# Patient Record
Sex: Male | Born: 1995 | Race: White | Hispanic: Yes | Marital: Single | Smoking: Never smoker
Health system: Southern US, Community
[De-identification: ages and names within clinical notes are randomized; demographics above are authoritative.]

---
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
## 2014-10-13 DIAGNOSIS — L84 Corns and callosities: Secondary | ICD-10-CM | POA: Insufficient documentation

## 2014-10-13 DIAGNOSIS — M79671 Pain in right foot: Secondary | ICD-10-CM | POA: Diagnosis present

## 2014-10-14 ENCOUNTER — Emergency Department (HOSPITAL_COMMUNITY)
Admission: EM | Admit: 2014-10-14 | Discharge: 2014-10-14 | Disposition: A | Discharge status: Home / Self Care | Payer: Medicaid Other | Attending: Emergency Medicine | Admitting: Emergency Medicine

## 2014-10-14 ENCOUNTER — Encounter (HOSPITAL_COMMUNITY): Payer: Self-pay | Admitting: Emergency Medicine

## 2014-10-14 DIAGNOSIS — L84 Corns and callosities: Secondary | ICD-10-CM

## 2014-10-14 NOTE — ED Provider Notes (Signed)
CSN: [id number]     Arrival date & time 10/13/14  2352 History   First MD Initiated Contact with Patient 10/14/14 0029     Chief Complaint  Patient presents with  . Foot Pain     (Consider location/radiation/quality/duration/timing/severity/associated sxs/prior Treatment) Patient is a 19 y.o. male presenting with lower extremity pain. The history is provided by the patient. No language interpreter was used.  Foot Pain This is a chronic problem. The current episode started more than 1 month ago. The problem occurs constantly. The problem has been waxing and waning. The symptoms are aggravated by walking.    History reviewed. No pertinent past medical history. History reviewed. No pertinent past surgical history. No family history on file. History  Substance Use Topics  . Smoking status: Never Smoker   . Smokeless tobacco: Not on file  . Alcohol Use: Yes    Review of Systems  All other systems reviewed and are negative.     Allergies  Review of patient's allergies indicates no known allergies.  Home Medications   Prior to Admission medications   Not on File   BP 116/67 mmHg  Pulse 64  Temp(Src) 98.2 F (36.8 C) (Oral)  Resp 18  Ht 5\' 11"  (1.803 m)  Wt 252 lb 14.4 oz (114.715 kg)  BMI 35.29 kg/m2  SpO2 96% Physical Exam  Constitutional: He is oriented to person, place, and time. He appears well-developed and well-nourished.  HENT:  Head: Normocephalic.  Eyes: Pupils are equal, round, and reactive to light.  Neck: Neck supple.  Cardiovascular: Normal rate and regular rhythm.   Pulmonary/Chest: Effort normal and breath sounds normal.  Abdominal: Soft. Bowel sounds are normal.  Musculoskeletal: He exhibits tenderness. He exhibits no edema.  Corn in arch of right foot  Lymphadenopathy:    He has no cervical adenopathy.  Neurological: He is alert and oriented to person, place, and time.  Skin: Skin is warm and dry.  Psychiatric: He has a normal mood and affect.   Nursing note and vitals reviewed.   ED Course  Procedures (including critical care time) Labs Review Labs Reviewed - No data to display  Imaging Review No results found.   EKG Interpretation None      MDM   Final diagnoses:  None    Foot corn.    Felicie Mornavid Lindy Garczynski, NP 10/14/14 [phone]  Geoffery Lyonsouglas Delo, MD 10/14/14 [phone]

## 2014-10-14 NOTE — ED Notes (Signed)
Pt presents with blister to bottom of right foot for the past 2 months- denies drainage from site.  Denies injury.

## 2014-10-14 NOTE — Discharge Instructions (Signed)
Corns and Calluses Corns are small areas of thickened skin that usually occur on the top, sides, or tip of a toe. They contain a cone-shaped core with a point that can press on a nerve below. This causes pain. Calluses are areas of thickened skin that usually develop on hands, fingers, palms, soles of the feet, and heels. These are areas that experience frequent friction or pressure. CAUSES  Corns are usually the result of rubbing (friction) or pressure from shoes that are too tight or do not fit properly. Calluses are caused by repeated friction and pressure on the affected areas. SYMPTOMS  A hard growth on the skin.  Pain or tenderness under the skin.  Sometimes, redness and swelling.  Increased discomfort while wearing tight-fitting shoes. DIAGNOSIS  Your caregiver can usually tell what the problem is by doing a physical exam. TREATMENT  Removing the cause of the friction or pressure is usually the only treatment needed. However, sometimes medicines can be used to help soften the hardened, thickened areas. These medicines include salicylic acid plasters and 12% ammonium lactate lotion. These medicines should only be used under the direction of your caregiver. HOME CARE INSTRUCTIONS   Try to remove pressure from the affected area.  You may wear donut-shaped corn pads to protect your skin.  You may use a pumice stone or nonmetallic nail file to gently reduce the thickness of a corn.  Wear properly fitted footwear.  If you have calluses on the hands, wear gloves during activities that cause friction.  If you have diabetes, you should regularly examine your feet. Tell your caregiver if you notice any problems with your feet. SEEK IMMEDIATE MEDICAL CARE IF:   You have increased pain, swelling, redness, or warmth in the affected area.  Your corn or callus starts to drain fluid or bleeds.  You are not getting better, even with treatment. Document Released: 04/09/2004 Document  Revised: 09/26/2011 Document Reviewed: 03/01/2011 [facility] Patient Information 2015 [employer] [place]. This information is not intended to replace advice given to you by your health care provider. Make sure you discuss any questions you have with your health care provider.  Stop by the pharmacy and pick up either curad medi-plast or Dr. Margart SicklesScholl's callous remover, and use as directed.

## 2019-05-31 DIAGNOSIS — Q631 Lobulated, fused and horseshoe kidney: Secondary | ICD-10-CM | POA: Insufficient documentation

## 2019-06-03 DIAGNOSIS — E7800 Pure hypercholesterolemia, unspecified: Secondary | ICD-10-CM | POA: Insufficient documentation

## 2023-02-19 ENCOUNTER — Ambulatory Visit
Admission: EM | Admit: 2023-02-19 | Discharge: 2023-02-19 | Disposition: A | Discharge status: Home / Self Care | Payer: 59 | Attending: Physician Assistant | Admitting: Physician Assistant

## 2023-02-19 ENCOUNTER — Other Ambulatory Visit: Payer: Self-pay

## 2023-02-19 DIAGNOSIS — Z113 Encounter for screening for infections with a predominantly sexual mode of transmission: Secondary | ICD-10-CM | POA: Insufficient documentation

## 2023-02-19 DIAGNOSIS — H6991 Unspecified Eustachian tube disorder, right ear: Secondary | ICD-10-CM | POA: Diagnosis not present

## 2023-02-19 MED ORDER — FLUTICASONE PROPIONATE 50 MCG/ACT NA SUSP: 1.0000 | Freq: Every day | NASAL | 0 refills | Status: DC

## 2023-02-19 NOTE — ED Triage Notes (Signed)
"  I have been having Right Ear Pain/fullness". First noticed "a week or two ago". No injury. No fever.

## 2023-02-19 NOTE — ED Provider Notes (Signed)
[facility]    CSN: [id number] Arrival date & time: 02/19/23  1409      History   Chief Complaint Chief Complaint  Patient presents with   Otalgia   Penis Problem    HPI Dakota Blackburn is a 27 y.o. male.   Patient here today for evaluation of right ear pain and fullness that started about a week or 2 ago.  He denies any fever.  He has not had any congestion, cough or sore throat.  He denies any known injury.  He does not report treatment for symptoms.  He also reports a penile lesion.  He states that he has a bump that was one of many, however others have resolved.  He denies any associated pain or itching.  He did not use any treatment for symptoms.  He is not concerned for STD but would like screening.  The history is provided by the patient.    History reviewed. No pertinent past medical history.  There are no problems to display for this patient.   History reviewed. No pertinent surgical history.     Home Medications    Prior to Admission medications   Medication Sig Start Date End Date Taking? Authorizing Provider  fluticasone (FLONASE) 50 MCG/ACT nasal spray Place 1 spray into both nostrils daily. 02/19/23  Yes Tomi Bamberger, PA-C    Family History History reviewed. No pertinent family history.  Social History Social History   Tobacco Use   Smoking status: Never   Smokeless tobacco: Never  Vaping Use   Vaping status: Never Used  Substance Use Topics   Alcohol use: Yes   Drug use: No     Allergies   Patient has no known allergies.   Review of Systems Review of Systems  Constitutional:  Negative for chills and fever.  HENT:  Positive for ear pain. Negative for congestion and sore throat.   Eyes:  Negative for discharge and redness.  Respiratory:  Negative for cough and shortness of breath.   Skin:  Negative for color change.  Neurological:  Negative for numbness.     Physical Exam Triage Vital Signs ED Triage Vitals   Encounter Vitals Group     BP      Systolic BP Percentile      Diastolic BP Percentile      Pulse      Resp      Temp      Temp src      SpO2      Weight      Height      Head Circumference      Peak Flow      Pain Score      Pain Loc      Pain Education      Exclude from Growth Chart    No data found.  Updated Vital Signs BP 129/88 (BP Location: Left Arm)   Pulse 78   Temp 98.6 F (37 C) (Oral)   Resp 20   Ht 5\' 11"  (1.803 m)   Wt (!) 320 lb (145.2 kg)   SpO2 97%   BMI 44.63 kg/m      Physical Exam Vitals and nursing note reviewed.  Constitutional:      General: He is not in acute distress.    Appearance: Normal appearance. He is not ill-appearing.  HENT:     Head: Normocephalic and atraumatic.     Left Ear: Tympanic membrane and ear canal normal.  Ears:     Comments: Right TM dull, nonerythematous    Nose: Nose normal. No congestion or rhinorrhea.     Mouth/Throat:     Mouth: Mucous membranes are moist.     Pharynx: Oropharynx is clear.  Eyes:     Conjunctiva/sclera: Conjunctivae normal.  Cardiovascular:     Rate and Rhythm: Normal rate.  Pulmonary:     Effort: Pulmonary effort is normal.  Genitourinary:    Comments: [name], RN chaperone- single 1 mm mildly erythematous papule noted to penis, no bleeding or drainage Neurological:     Mental Status: He is alert.  Psychiatric:        Mood and Affect: Mood normal.        Behavior: Behavior normal.        Thought Content: Thought content normal.      UC Treatments / Results  Labs (all labs ordered are listed, but only abnormal results are displayed) Labs Reviewed  RPR  HIV ANTIBODY (ROUTINE TESTING W REFLEX)  HEPATITIS PANEL, ACUTE  CYTOLOGY, (ORAL, ANAL, URETHRAL) ANCILLARY ONLY    EKG   Radiology No results found.  Procedures Procedures (including critical care time)  Medications Ordered in UC Medications - No data to display  Initial Impression / Assessment and Plan  / UC Course  I have reviewed the triage vital signs and the nursing notes.  Pertinent labs & imaging results that were available during my care of the patient were reviewed by me and considered in my medical decision making (see chart for details).    Suspect that your discomfort is due to eustachian tube dysfunction and recommended Flonase.  Advised follow-up if no gradual improvement or with any further concerns.  Discussed that penile lesion does not appear consistent with herpes or syphilis.  STD screening ordered.  Will await results for further recommendation but encouraged follow-up should rash return or lesion not resolved.  Final Clinical Impressions(s) / UC Diagnoses   Final diagnoses:  Screening for STD (sexually transmitted disease)  Dysfunction of right eustachian tube   Discharge Instructions   None    ED Prescriptions     Medication Sig Dispense Auth. Provider   fluticasone (FLONASE) 50 MCG/ACT nasal spray Place 1 spray into both nostrils daily. 48 g Tomi Bamberger, PA-C      PDMP not reviewed this encounter.   Tomi Bamberger, PA-C 02/19/23 [phone]

## 2023-02-19 NOTE — ED Triage Notes (Signed)
"  I also notice a bump on penis, on shaft, would like that looked at". No discharge. No dysuria. No concern for STI.

## 2023-02-24 ENCOUNTER — Ambulatory Visit
Admission: EM | Admit: 2023-02-24 | Discharge: 2023-02-24 | Disposition: A | Discharge status: Home / Self Care | Payer: 59 | Attending: Physician Assistant | Admitting: Physician Assistant

## 2023-02-24 DIAGNOSIS — H65191 Other acute nonsuppurative otitis media, right ear: Secondary | ICD-10-CM | POA: Diagnosis not present

## 2023-02-24 DIAGNOSIS — R519 Headache, unspecified: Secondary | ICD-10-CM | POA: Diagnosis not present

## 2023-02-24 MED ORDER — KETOROLAC TROMETHAMINE 30 MG/ML IJ SOLN
30.0000 mg | Freq: Once | INTRAMUSCULAR | Status: AC
  Administered 2023-02-24: 30 mg via INTRAMUSCULAR

## 2023-02-24 MED ORDER — KETOROLAC TROMETHAMINE 30 MG/ML IJ SOLN: 30.0000 mg | Freq: Once | INTRAMUSCULAR | Status: DC

## 2023-02-24 MED ORDER — AMOXICILLIN 500 MG PO CAPS: 500.0000 mg | ORAL_CAPSULE | Freq: Three times a day (TID) | ORAL | 0 refills | Status: DC

## 2023-02-24 NOTE — ED Provider Notes (Signed)
[facility]    CSN: [id number] Arrival date & time: 02/24/23  1608      History   Chief Complaint Chief Complaint  Patient presents with   Headache    HPI Dakota Blackburn is a 27 y.o. male.   Patient here today for continued headache and ear pressure. He reports he has been using flonase as prescribed. He notes headache is around the back of his head. He has not had any nausea, vomiting or diarrhea. He denies fever, sore throat or cough. He has not taken any medication for headache.  The history is provided by the patient.    History reviewed. No pertinent past medical history.  There are no problems to display for this patient.   History reviewed. No pertinent surgical history.     Home Medications    Prior to Admission medications   Medication Sig Start Date End Date Taking? Authorizing Provider  amoxicillin (AMOXIL) 500 MG capsule Take 1 capsule (500 mg total) by mouth 3 (three) times daily. 02/24/23  Yes Tomi Bamberger, PA-C  fluticasone (FLONASE) 50 MCG/ACT nasal spray Place 1 spray into both nostrils daily. 02/19/23   Tomi Bamberger, PA-C    Family History History reviewed. No pertinent family history.  Social History Social History   Tobacco Use   Smoking status: Never   Smokeless tobacco: Never  Vaping Use   Vaping status: Never Used  Substance Use Topics   Alcohol use: Yes   Drug use: No     Allergies   Patient has no known allergies.   Review of Systems Review of Systems  Constitutional:  Negative for chills and fever.  HENT:  Positive for ear pain. Negative for congestion and sore throat.   Eyes:  Negative for discharge and redness.  Respiratory:  Negative for cough and shortness of breath.   Gastrointestinal:  Negative for nausea and vomiting.  Neurological:  Positive for headaches.     Physical Exam Triage Vital Signs ED Triage Vitals  Encounter Vitals Group     BP      Systolic BP Percentile      Diastolic BP  Percentile      Pulse      Resp      Temp      Temp src      SpO2      Weight      Height      Head Circumference      Peak Flow      Pain Score      Pain Loc      Pain Education      Exclude from Growth Chart    No data found.  Updated Vital Signs BP (!) 143/82 (BP Location: Left Arm)   Pulse 72   Temp 97.8 F (36.6 C) (Oral)   Resp 16   SpO2 94%      Physical Exam Vitals and nursing note reviewed.  Constitutional:      General: He is not in acute distress.    Appearance: Normal appearance. He is not ill-appearing.     Comments: Smiling on exam  HENT:     Head: Normocephalic and atraumatic.     Left Ear: Tympanic membrane normal.     Ears:     Comments: Right TM erythematous    Nose: Nose normal. No congestion.  Eyes:     Conjunctiva/sclera: Conjunctivae normal.  Cardiovascular:     Rate and Rhythm: Normal rate.  Pulmonary:     Effort: Pulmonary effort is normal. No respiratory distress.  Skin:    General: Skin is warm and dry.  Neurological:     Mental Status: He is alert.  Psychiatric:        Mood and Affect: Mood normal.        Thought Content: Thought content normal.      UC Treatments / Results  Labs (all labs ordered are listed, but only abnormal results are displayed) Labs Reviewed - No data to display  EKG   Radiology No results found.  Procedures Procedures (including critical care time)  Medications Ordered in UC Medications  ketorolac (TORADOL) 30 MG/ML injection 30 mg (has no administration in time range)    Initial Impression / Assessment and Plan / UC Course  I have reviewed the triage vital signs and the nursing notes.  Pertinent labs & imaging results that were available during my care of the patient were reviewed by me and considered in my medical decision making (see chart for details).    Toradol injection administered in office and Amoxicillin prescribed for otitis media coverage. Advised to avoid ibuprofen or aleve  the remainder of the day. Encouraged follow up if no gradual improvement or with any further concerns.   Final Clinical Impressions(s) / UC Diagnoses   Final diagnoses:  Acute nonintractable headache, unspecified headache type  Other acute nonsuppurative otitis media of right ear, recurrence not specified   Discharge Instructions   None    ED Prescriptions     Medication Sig Dispense Auth. Provider   amoxicillin (AMOXIL) 500 MG capsule Take 1 capsule (500 mg total) by mouth 3 (three) times daily. 21 capsule Tomi Bamberger, PA-C      PDMP not reviewed this encounter.   Tomi Bamberger, PA-C 02/24/23 [phone]

## 2023-02-24 NOTE — ED Triage Notes (Signed)
Pt states he has had a headache for the past 2 weeks.  Seen here for the same this week.

## 2023-03-03 ENCOUNTER — Other Ambulatory Visit: Payer: Self-pay

## 2023-03-03 ENCOUNTER — Emergency Department (HOSPITAL_COMMUNITY)
Admission: EM | Admit: 2023-03-03 | Discharge: 2023-03-04 | Disposition: A | Discharge status: Home / Self Care | Payer: 59 | Source: Home / Self Care | Attending: Emergency Medicine | Admitting: Emergency Medicine

## 2023-03-03 ENCOUNTER — Encounter (HOSPITAL_COMMUNITY): Payer: Self-pay | Admitting: *Deleted

## 2023-03-03 DIAGNOSIS — R0981 Nasal congestion: Secondary | ICD-10-CM | POA: Diagnosis present

## 2023-03-03 DIAGNOSIS — R519 Headache, unspecified: Secondary | ICD-10-CM | POA: Insufficient documentation

## 2023-03-03 DIAGNOSIS — G44209 Tension-type headache, unspecified, not intractable: Secondary | ICD-10-CM

## 2023-03-03 DIAGNOSIS — U071 COVID-19: Secondary | ICD-10-CM | POA: Insufficient documentation

## 2023-03-03 NOTE — ED Triage Notes (Signed)
Headache for   3-4 weeks he has seen  his doctors x2 and was given some med he reports that the med did not help

## 2023-03-04 LAB — RESP PANEL BY RT-PCR (RSV, FLU A&B, COVID)  RVPGX2
Influenza A by PCR: NEGATIVE
Influenza B by PCR: NEGATIVE
Resp Syncytial Virus by PCR: NEGATIVE
SARS Coronavirus 2 by RT PCR: POSITIVE — AB

## 2023-03-04 MED ORDER — ACETAMINOPHEN 500 MG PO TABS
1000.0000 mg | ORAL_TABLET | Freq: Once | ORAL | Status: AC
  Administered 2023-03-04: 1000 mg via ORAL
  Filled 2023-03-04: qty 2

## 2023-03-04 NOTE — ED Provider Notes (Signed)
[facility] Provider Note  CSN: [id number] Arrival date & time: 03/03/23 2158  Chief Complaint(s) Headache  HPI Dakota Blackburn is a 27 y.o. male with no pertinent past medical history who presents to the emergency department with several weeks of generalized headache.  Initially seen at urgent care and thought to be related to nasal congestion/allergies.  Patient provided with Flonase which did not help.  Seen again at urgent care last week and noted to have possible right otitis media, prescribed amoxicillin.  No improvement with this.  He did report taking over-the-counter medicine initially which did provide help however he decided to stop taking the medicine as he did not want to take pain medicine daily.  He denies any fevers or chills.  No coughing or congestion.  No visual disturbance.  No focal deficits.  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  He reports working 14-hour plus days over the past several weeks.  States that he has been trying to hydrate during that time as well.  States that he has only been getting 6 hours of sleep at night.  Patient did report losing taste yesterday.  The history is provided by the patient.    Past Medical History History reviewed. No pertinent past medical history. There are no problems to display for this patient.  Home Medication(s) Prior to Admission medications   Medication Sig Start Date End Date Taking? Authorizing Provider  amoxicillin (AMOXIL) 500 MG capsule Take 1 capsule (500 mg total) by mouth 3 (three) times daily. 02/24/23   Tomi Bamberger, PA-C  fluticasone (FLONASE) 50 MCG/ACT nasal spray Place 1 spray into both nostrils daily. 02/19/23   Tomi Bamberger, PA-C                                                                                                                                    Allergies Patient has no known allergies.  Review of Systems Review of  Systems As noted in HPI  Physical Exam Vital Signs  I have reviewed the triage vital signs BP 105/87   Pulse 72   Temp 98.1 F (36.7 C)   Resp 14   Ht 5\' 11"  (1.803 m)   Wt (!) 145.2 kg   SpO2 97%   BMI 44.65 kg/m   Physical Exam Vitals reviewed.  Constitutional:      General: He is not in acute distress.    Appearance: He is well-developed. He is not diaphoretic.  HENT:     Head: Normocephalic and atraumatic.     Right Ear: Tympanic membrane normal.     Left Ear: Tympanic membrane normal.     Nose: Nose normal.  Eyes:     General: No scleral icterus.       Right eye: No discharge.        Left eye: No discharge.  Conjunctiva/sclera: Conjunctivae normal.     Pupils: Pupils are equal, round, and reactive to light.  Cardiovascular:     Rate and Rhythm: Normal rate and regular rhythm.     Heart sounds: No murmur heard.    No friction rub. No gallop.  Pulmonary:     Effort: Pulmonary effort is normal. No respiratory distress.     Breath sounds: Normal breath sounds. No stridor. No rales.  Abdominal:     General: There is no distension.     Palpations: Abdomen is soft.     Tenderness: There is no abdominal tenderness.  Musculoskeletal:        General: No tenderness.     Cervical back: Normal range of motion and neck supple.  Skin:    General: Skin is warm and dry.     Findings: No erythema or rash.  Neurological:     Mental Status: He is alert and oriented to person, place, and time.     Comments: Mental Status:  Alert and oriented to person, place, and time.  Attention and concentration normal.  Speech clear.  Recent memory is intact  Cranial Nerves:  II Visual Fields: Intact to confrontation. Visual fields intact. III, IV, VI: Pupils equal and reactive to light and near. Full eye movement without nystagmus. Strabismus (baseline per patient)  V Facial Sensation: Normal. No weakness of masticatory muscles  VII: No facial weakness or asymmetry  VIII Auditory  Acuity: Grossly normal  IX/X: The uvula is midline; the palate elevates symmetrically  XI: Normal sternocleidomastoid and trapezius strength  XII: The tongue is midline. No atrophy or fasciculations.   Motor System: Muscle Strength: 5/5 and symmetric in the upper and lower extremities. No pronation or drift.  Muscle Tone: Tone and muscle bulk are normal in the upper and lower extremities.  Reflexes: DTRs: 1+ and symmetrical in all four extremities. No Clonus Coordination: Intact finger-to-nose, heel-to-shin. No tremor.  Sensation: Intact to light touch, and pinprick. Negative Romberg test.  Gait: Routine and tandem gait normal.      ED Results and Treatments Labs (all labs ordered are listed, but only abnormal results are displayed) Labs Reviewed  RESP PANEL BY RT-PCR (RSV, FLU A&B, COVID)  [id number] - Abnormal; Notable for the following components:      Result Value   SARS Coronavirus 2 by RT PCR POSITIVE (*)    All other components within normal limits                                                                                                                         EKG  EKG Interpretation Date/Time:    Ventricular Rate:    PR Interval:    QRS Duration:    QT Interval:    QTC Calculation:   R Axis:      Text Interpretation:         Radiology No results found.  Medications Ordered in ED Medications  acetaminophen (TYLENOL) tablet 1,000  mg (1,000 mg Oral Given 03/04/23 2130)   Procedures Procedures  (including critical care time) Medical Decision Making / ED Course   Medical Decision Making Risk OTC drugs.    Generalized headache for the pt. Non focal neuro exam.  No recent head trauma.  Doubt intracranial bleed.  Doubt IIH.  Doubt tumor.  No indication for imaging.  No fever. Doubt meningitis.  Covid sent. Patient instructed to check MyChart for results.     Final Clinical Impression(s) / ED Diagnoses Final diagnoses:  Acute non intractable  tension-type headache   The patient appears reasonably screened and/or stabilized for discharge and I doubt any other medical condition or other [facility] requiring further screening, evaluation, or treatment in the ED at this time. I have discussed the findings, Dx and Tx plan with the patient/family who expressed understanding and agree(s) with the plan. Discharge instructions discussed at length. The patient/family was given strict return precautions who verbalized understanding of the instructions. No further questions at time of discharge.  Disposition: Discharge  Condition: Good  ED Discharge Orders     None        Follow Up: Primary care provider  Schedule an appointment as soon as possible for a visit  if you do not have a primary care physician, contact [facility] at [phone] for referral     This chart was dictated using voice recognition software.  Despite best efforts to proofread,  errors can occur which can change the documentation meaning.    Nira Conn, MD 03/04/23 [phone]

## 2023-08-19 ENCOUNTER — Encounter: Payer: Self-pay | Admitting: Physician Assistant

## 2023-08-19 ENCOUNTER — Ambulatory Visit
Admission: EM | Admit: 2023-08-19 | Discharge: 2023-08-19 | Disposition: A | Discharge status: Home / Self Care | Payer: 59

## 2023-08-19 ENCOUNTER — Ambulatory Visit (HOSPITAL_BASED_OUTPATIENT_CLINIC_OR_DEPARTMENT_OTHER)
Admission: RE | Admit: 2023-08-19 | Discharge: 2023-08-19 | Disposition: A | Discharge status: Home / Self Care | Payer: 59 | Source: Ambulatory Visit | Attending: Physician Assistant | Admitting: Physician Assistant

## 2023-08-19 DIAGNOSIS — R0602 Shortness of breath: Secondary | ICD-10-CM | POA: Insufficient documentation

## 2023-08-19 DIAGNOSIS — R0789 Other chest pain: Secondary | ICD-10-CM | POA: Diagnosis present

## 2023-08-19 DIAGNOSIS — J4541 Moderate persistent asthma with (acute) exacerbation: Secondary | ICD-10-CM | POA: Diagnosis not present

## 2023-08-19 MED ORDER — IPRATROPIUM-ALBUTEROL 0.5-2.5 (3) MG/3ML IN SOLN
3.0000 mL | Freq: Once | RESPIRATORY_TRACT | Status: AC
  Administered 2023-08-19: 3 mL via RESPIRATORY_TRACT

## 2023-08-19 MED ORDER — PREDNISONE 10 MG (21) PO TBPK: ORAL_TABLET | ORAL | 0 refills | Status: AC

## 2023-08-19 MED ORDER — BUDESONIDE-FORMOTEROL FUMARATE 80-4.5 MCG/ACT IN AERO
2.0000 | INHALATION_SPRAY | Freq: Two times a day (BID) | RESPIRATORY_TRACT | 0 refills | Status: AC
Start: 2023-08-19 — End: ?

## 2023-08-19 MED ORDER — METHYLPREDNISOLONE ACETATE 80 MG/ML IJ SUSP
60.0000 mg | Freq: Once | INTRAMUSCULAR | Status: AC
  Administered 2023-08-19: 60 mg via INTRAMUSCULAR

## 2023-08-19 MED ORDER — PROMETHAZINE-DM 6.25-15 MG/5ML PO SYRP: 5.0000 mL | ORAL_SOLUTION | Freq: Three times a day (TID) | ORAL | 0 refills | Status: AC | PRN

## 2023-08-19 MED ORDER — ALBUTEROL SULFATE HFA 108 (90 BASE) MCG/ACT IN AERS: 1.0000 | INHALATION_SPRAY | Freq: Four times a day (QID) | RESPIRATORY_TRACT | 0 refills | Status: AC | PRN

## 2023-08-19 MED ORDER — ALBUTEROL SULFATE (2.5 MG/3ML) 0.083% IN NEBU
2.5000 mg | INHALATION_SOLUTION | Freq: Once | RESPIRATORY_TRACT | Status: AC
  Administered 2023-08-19: 2.5 mg via RESPIRATORY_TRACT

## 2023-08-19 NOTE — ED Triage Notes (Signed)
"  This started with Fever and body aches on Wednesday, this continued even after going to work, Thursday night again during night continuous with Cough, @ times when I cough the top part of my back hurts with ? Sob or trouble breathing". Fever "in beginning, by touch only" (none now).

## 2023-08-19 NOTE — ED Provider Notes (Signed)
[facility]    CSN: [id number] Arrival date & time: 08/19/23  1141      History   Chief Complaint Chief Complaint  Patient presents with   Cough   Generalized Body Aches   Breathing Concerns    HPI Dakota Blackburn is a 28 y.o. male.   Patient presents today with a 5-day history of URI symptoms including cough, congestion, chest tightness, wheezing, pain in his back.  He reports initially had a fever and diarrhea but the symptoms have improved.  He denies any known sick contacts but reports that multiple people at his place of employment have been out sick recently.  He has had COVID in the past with last episode approximately 6 months ago.  He denies any history of allergies, asthma, COPD, smoking.  Denies any history of diabetes.  Denies any recent antibiotics or steroids.    History reviewed. No pertinent past medical history.  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 No pertinent surgical history.     Home Medications    Prior to Admission medications   Medication Sig Start Date End Date Taking? Authorizing Provider  albuterol (VENTOLIN HFA) 108 (90 Base) MCG/ACT inhaler Inhale 1-2 puffs into the lungs every 6 (six) hours as needed for wheezing or shortness of breath. 08/19/23  Yes Aberdeen Hafen, Denny Peon K, PA-C  budesonide-formoterol (SYMBICORT) 80-4.5 MCG/ACT inhaler Inhale 2 puffs into the lungs in the morning and at bedtime. 08/19/23  Yes Eugenia Eldredge K, PA-C  predniSONE (STERAPRED UNI-PAK 21 TAB) 10 MG (21) TBPK tablet As directed 08/19/23  Yes Brittnae Aschenbrenner K, PA-C  promethazine-dextromethorphan (PROMETHAZINE-DM) 6.25-15 MG/5ML syrup Take 5 mLs by mouth 3 (three) times daily as needed for cough. 08/19/23  Yes Adalay Azucena, Noberto Retort, PA-C  UNABLE TO FIND Med Name: TheraFlu Tea   Yes [provider]    Family History History reviewed. No  pertinent family history.  Social History Social History   Tobacco Use   Smoking status: Never   Smokeless tobacco: Never  Vaping Use   Vaping status: Never Used  Substance Use Topics   Alcohol use: Yes    Comment: Occassionally.   Drug use: Never     Allergies   Patient has no known allergies.   Review of Systems Review of Systems  Constitutional:  Positive for activity change. Negative for appetite change, fatigue and fever (resolved).  HENT:  Positive for congestion. Negative for sinus pressure, sneezing and sore throat.   Respiratory:  Positive for cough, chest tightness, shortness of breath and wheezing.   Cardiovascular:  Negative for chest pain.  Gastrointestinal:  Negative for abdominal pain, diarrhea (resolved), nausea and vomiting.  Musculoskeletal:  Positive for arthralgias, back pain and myalgias.  Neurological:  Positive for headaches. Negative for dizziness and light-headedness.     Physical Exam Triage Vital Signs ED Triage Vitals  Encounter Vitals Group     BP 08/19/23 1238 117/80     Systolic BP Percentile --      Diastolic BP Percentile --      Pulse Rate 08/19/23 1238 90     Resp 08/19/23 1238 18     Temp 08/19/23 1238 99 F (37.2 C)     Temp Source 08/19/23 1238 Oral     SpO2 08/19/23 1238 98 %     Weight 08/19/23 1235 (!) 323 lb (146.5 kg)  Height 08/19/23 1235 5\' 11"  (1.803 m)     Head Circumference --      Peak Flow --      Pain Score 08/19/23 1235 0     Pain Loc --      Pain Education --      Exclude from Growth Chart --    No data found.  Updated Vital Signs BP 117/80 (BP Location: Left Arm)   Pulse 84   Temp 99 F (37.2 C) (Oral)   Resp 20   Ht 5\' 11"  (1.803 m)   Wt (!) 323 lb (146.5 kg)   SpO2 98% Comment: Post Treatment (Immediately after)  BMI 45.05 kg/m   Visual Acuity Right Eye Distance:   Left Eye Distance:   Bilateral Distance:    Right Eye Near:   Left Eye Near:    Bilateral Near:     Physical  Exam Vitals reviewed.  Constitutional:      General: He is awake.     Appearance: Normal appearance. He is well-developed. He is not ill-appearing.     Comments: Very pleasant male appears stated age in no acute distress sitting comfortably in exam room  HENT:     Head: Normocephalic and atraumatic.     Right Ear: Tympanic membrane, ear canal and external ear normal. Tympanic membrane is not erythematous or bulging.     Left Ear: Tympanic membrane, ear canal and external ear normal. Tympanic membrane is not erythematous or bulging.     Nose: Nose normal.     Mouth/Throat:     Pharynx: Uvula midline. No oropharyngeal exudate, posterior oropharyngeal erythema or uvula swelling.  Cardiovascular:     Rate and Rhythm: Normal rate and regular rhythm.     Heart sounds: Normal heart sounds, S1 normal and S2 normal. No murmur heard. Pulmonary:     Effort: Pulmonary effort is normal. No accessory muscle usage or respiratory distress.     Breath sounds: No stridor. Wheezing present. No rhonchi or rales.     Comments: Widespread wheezing but improved following breathing treatment in clinic. Neurological:     Mental Status: He is alert.  Psychiatric:        Behavior: Behavior is cooperative.      UC Treatments / Results  Labs (all labs ordered are listed, but only abnormal results are displayed) Labs Reviewed - No data to display  EKG   Radiology No results found.  Procedures Procedures (including critical care time)  Medications Ordered in UC Medications  ipratropium-albuterol (DUONEB) 0.5-2.5 (3) MG/3ML nebulizer solution 3 mL (3 mLs Nebulization Given 08/19/23 1306)  methylPREDNISolone acetate (DEPO-MEDROL) injection 60 mg (60 mg Intramuscular Given 08/19/23 1306)  albuterol (PROVENTIL) (2.5 MG/3ML) 0.083% nebulizer solution 2.5 mg (2.5 mg Nebulization Given 08/19/23 1335)    Initial Impression / Assessment and Plan / UC Course  I have reviewed the triage vital signs and the nursing  notes.  Pertinent labs & imaging results that were available during my care of the patient were reviewed by me and considered in my medical decision making (see chart for details).     Patient is well-appearing, afebrile, nontoxic, nontachycardic.  No evidence of acute infection on physical exam that would warrant initiation of antibiotics.  Viral testing was deferred as he has been symptomatic for 5 days and this would not change our management.  He was given DuoNeb and then a follow-up albuterol nebulizer with minimal improvement of symptoms.  He was also given Depo-Medrol in  clinic.  Will start prednisone tomorrow and he was instructed not to take NSAIDs with this medication due to risk of GI bleeding.  He was given Symbicort and instructed to rinse his mouth following use of this medication to prevent thrush.  Discussed that if his symptoms are not improving within a few days or if anything worsens he needs to go to the emergency room for further evaluation and management.  Strict return precautions given.  Work excuse note provided.  Final Clinical Impressions(s) / UC Diagnoses   Final diagnoses:  Moderate persistent asthma with acute exacerbation     Discharge Instructions      Start prednisone tomorrow (08/20/2023).  Do not take NSAIDs with this medication including aspirin, ibuprofen/Advil, naproxen/Aleve.  Use albuterol every 4-6 hours as needed for shortness of breath and coughing fits.  Use Symbicort twice daily.  Rinse your mouth following use of this medication as it can cause thrush.  Go get the chest x-ray.  I will contact you if we need to start an antibiotic.  Use Promethazine DM for cough.  This will make you sleepy so do not drive or drink alcohol with taking it.  Make sure you rest and drink plenty of fluid.  If your symptoms are not improving within a few days or if anything worsens and you have worsening cough, shortness of breath, chest pain, nausea/vomiting you need to go to  the ER.     ED Prescriptions     Medication Sig Dispense Auth. Provider   predniSONE (STERAPRED UNI-PAK 21 TAB) 10 MG (21) TBPK tablet As directed 21 tablet Demarko Zeimet K, PA-C   albuterol (VENTOLIN HFA) 108 (90 Base) MCG/ACT inhaler Inhale 1-2 puffs into the lungs every 6 (six) hours as needed for wheezing or shortness of breath. 18 g Addisyn Leclaire K, PA-C   promethazine-dextromethorphan (PROMETHAZINE-DM) 6.25-15 MG/5ML syrup Take 5 mLs by mouth 3 (three) times daily as needed for cough. 118 mL Breckin Zafar K, PA-C   budesonide-formoterol (SYMBICORT) 80-4.5 MCG/ACT inhaler Inhale 2 puffs into the lungs in the morning and at bedtime. 1 each Yvana Samonte, Noberto Retort, PA-C      PDMP not reviewed this encounter.   Jeani Hawking, PA-C 08/19/23 1404

## 2023-08-19 NOTE — ED Notes (Signed)
 Vital signs stable.

## 2023-08-19 NOTE — Discharge Instructions (Signed)
Start prednisone tomorrow (08/20/2023).  Do not take NSAIDs with this medication including aspirin, ibuprofen/Advil, naproxen/Aleve.  Use albuterol every 4-6 hours as needed for shortness of breath and coughing fits.  Use Symbicort twice daily.  Rinse your mouth following use of this medication as it can cause thrush.  Go get the chest x-ray.  I will contact you if we need to start an antibiotic.  Use Promethazine DM for cough.  This will make you sleepy so do not drive or drink alcohol with taking it.  Make sure you rest and drink plenty of fluid.  If your symptoms are not improving within a few days or if anything worsens and you have worsening cough, shortness of breath, chest pain, nausea/vomiting you need to go to the ER.
